# Patient Record
Sex: Female | Born: 1940 | Race: White | Hispanic: No | Marital: Single | Smoking: Former smoker
Health system: Southern US, Community
[De-identification: ages and names within clinical notes are randomized; demographics above are authoritative.]

## PROBLEM LIST (undated history)

## (undated) DIAGNOSIS — C4490 Unspecified malignant neoplasm of skin, unspecified: Secondary | ICD-10-CM

## (undated) DIAGNOSIS — K219 Gastro-esophageal reflux disease without esophagitis: Secondary | ICD-10-CM

## (undated) DIAGNOSIS — R413 Other amnesia: Secondary | ICD-10-CM

## (undated) DIAGNOSIS — E039 Hypothyroidism, unspecified: Secondary | ICD-10-CM

## (undated) DIAGNOSIS — Z972 Presence of dental prosthetic device (complete) (partial): Secondary | ICD-10-CM

## (undated) DIAGNOSIS — I4891 Unspecified atrial fibrillation: Secondary | ICD-10-CM

## (undated) DIAGNOSIS — M1990 Unspecified osteoarthritis, unspecified site: Secondary | ICD-10-CM

## (undated) DIAGNOSIS — M47817 Spondylosis without myelopathy or radiculopathy, lumbosacral region: Secondary | ICD-10-CM

## (undated) DIAGNOSIS — M4316 Spondylolisthesis, lumbar region: Secondary | ICD-10-CM

## (undated) DIAGNOSIS — E119 Type 2 diabetes mellitus without complications: Secondary | ICD-10-CM

## (undated) DIAGNOSIS — I10 Essential (primary) hypertension: Secondary | ICD-10-CM

## (undated) DIAGNOSIS — T753XXA Motion sickness, initial encounter: Secondary | ICD-10-CM

## (undated) DIAGNOSIS — C801 Malignant (primary) neoplasm, unspecified: Secondary | ICD-10-CM

## (undated) DIAGNOSIS — E079 Disorder of thyroid, unspecified: Secondary | ICD-10-CM

## (undated) HISTORY — PX: LUNG BIOPSY: SHX232

## (undated) HISTORY — PX: COLONOSCOPY: SHX174

## (undated) HISTORY — PX: BILATERAL CARPAL TUNNEL RELEASE: SHX6508

## (undated) HISTORY — PX: PORTACATH PLACEMENT: SHX2246

## (undated) HISTORY — PX: CHOLECYSTECTOMY: SHX55

## (undated) HISTORY — PX: TONSILLECTOMY: SUR1361

---

## 2010-11-02 ENCOUNTER — Ambulatory Visit: Payer: Self-pay | Admitting: Family Medicine

## 2011-04-09 ENCOUNTER — Ambulatory Visit: Payer: Self-pay | Admitting: Internal Medicine

## 2011-06-11 DIAGNOSIS — H2510 Age-related nuclear cataract, unspecified eye: Secondary | ICD-10-CM | POA: Insufficient documentation

## 2011-06-11 DIAGNOSIS — H43819 Vitreous degeneration, unspecified eye: Secondary | ICD-10-CM | POA: Insufficient documentation

## 2011-06-11 DIAGNOSIS — H268 Other specified cataract: Secondary | ICD-10-CM | POA: Insufficient documentation

## 2012-07-27 ENCOUNTER — Ambulatory Visit: Payer: Self-pay | Admitting: Internal Medicine

## 2013-04-15 DIAGNOSIS — M720 Palmar fascial fibromatosis [Dupuytren]: Secondary | ICD-10-CM | POA: Insufficient documentation

## 2013-07-14 DIAGNOSIS — M545 Low back pain, unspecified: Secondary | ICD-10-CM | POA: Insufficient documentation

## 2013-07-14 DIAGNOSIS — G8929 Other chronic pain: Secondary | ICD-10-CM | POA: Insufficient documentation

## 2013-08-15 DIAGNOSIS — Z720 Tobacco use: Secondary | ICD-10-CM | POA: Insufficient documentation

## 2013-09-07 ENCOUNTER — Ambulatory Visit: Payer: Self-pay | Admitting: Physician Assistant

## 2013-10-14 DIAGNOSIS — S32009A Unspecified fracture of unspecified lumbar vertebra, initial encounter for closed fracture: Secondary | ICD-10-CM | POA: Insufficient documentation

## 2013-10-14 DIAGNOSIS — S32000A Wedge compression fracture of unspecified lumbar vertebra, initial encounter for closed fracture: Secondary | ICD-10-CM | POA: Insufficient documentation

## 2013-10-14 DIAGNOSIS — E039 Hypothyroidism, unspecified: Secondary | ICD-10-CM | POA: Insufficient documentation

## 2014-01-02 ENCOUNTER — Ambulatory Visit: Payer: Self-pay | Admitting: Family Medicine

## 2014-01-05 DIAGNOSIS — I480 Paroxysmal atrial fibrillation: Secondary | ICD-10-CM | POA: Insufficient documentation

## 2014-01-12 DIAGNOSIS — I10 Essential (primary) hypertension: Secondary | ICD-10-CM | POA: Insufficient documentation

## 2014-02-11 IMAGING — CR RIGHT FOOT COMPLETE - 3+ VIEW
1 series · 3 of 3 positions shown · non-contrast
Comparison: none

REASON FOR EXAM: Pain in joint involving ankle and foot right foot pain
COMMENTS:

[Series 1: ap · 0.17mm/px · 3 of 3 slices shown]
[im 1/3]
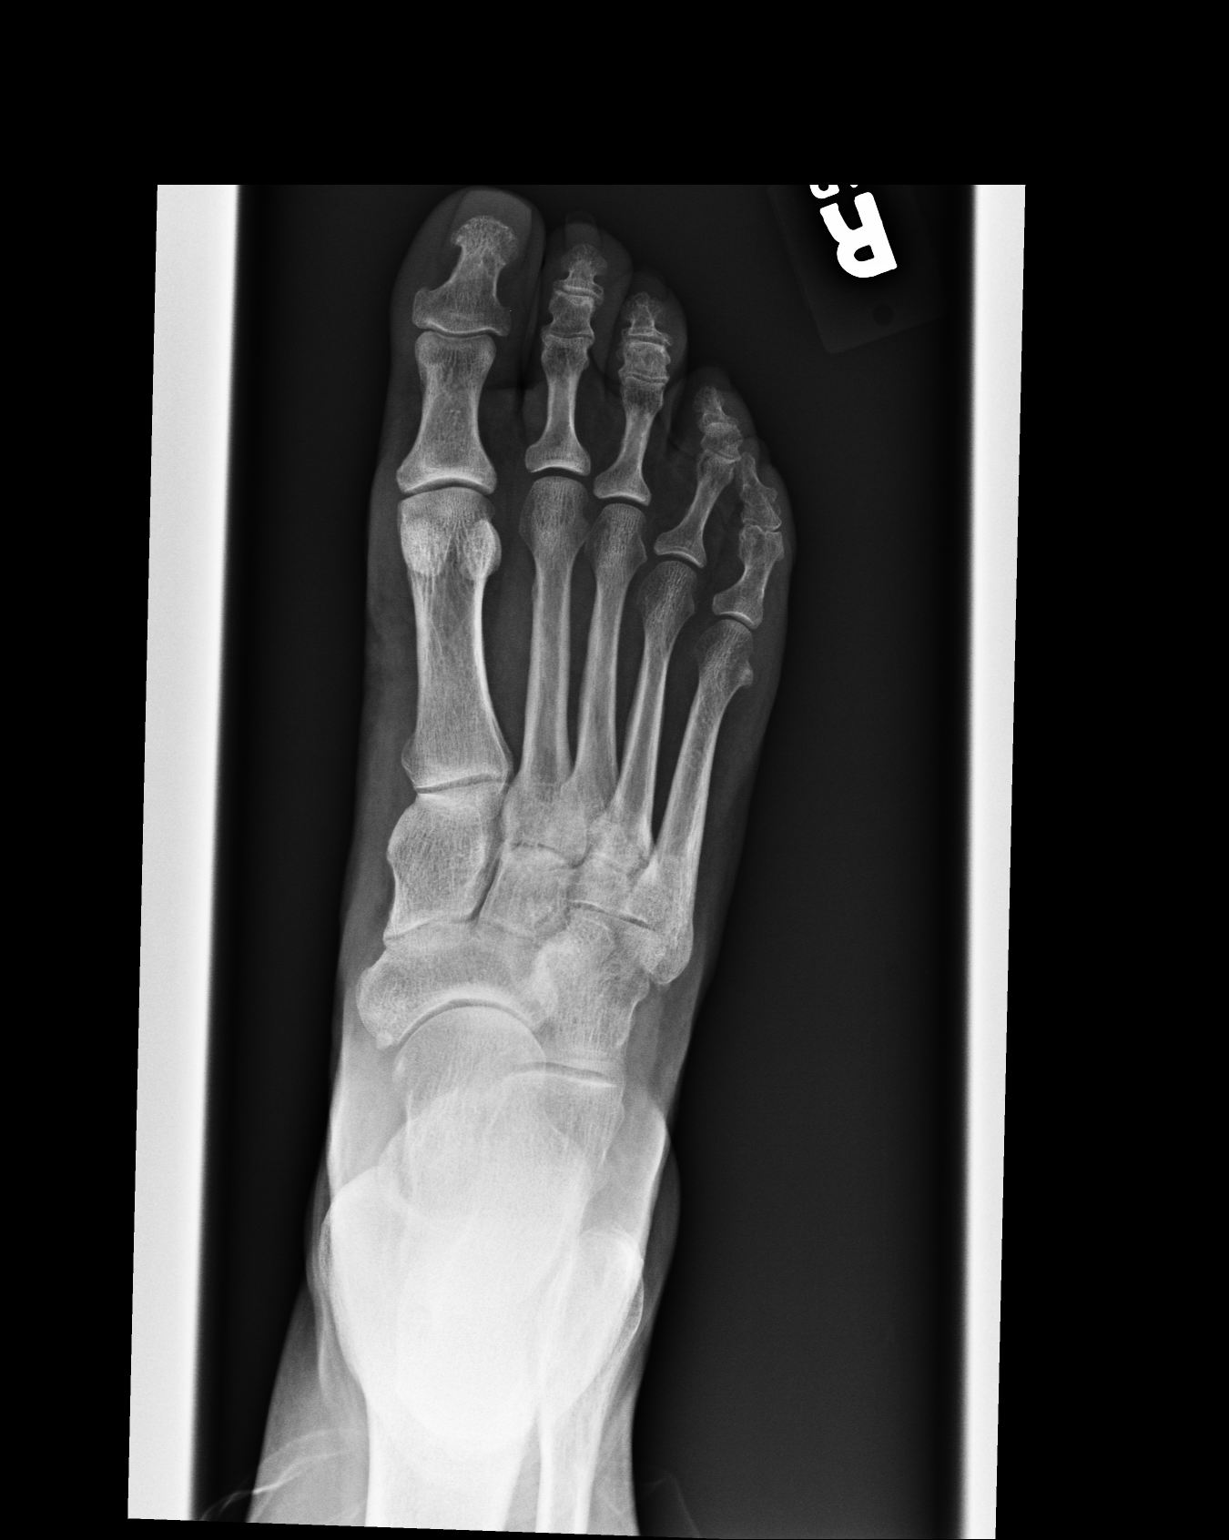
[im 2/3]
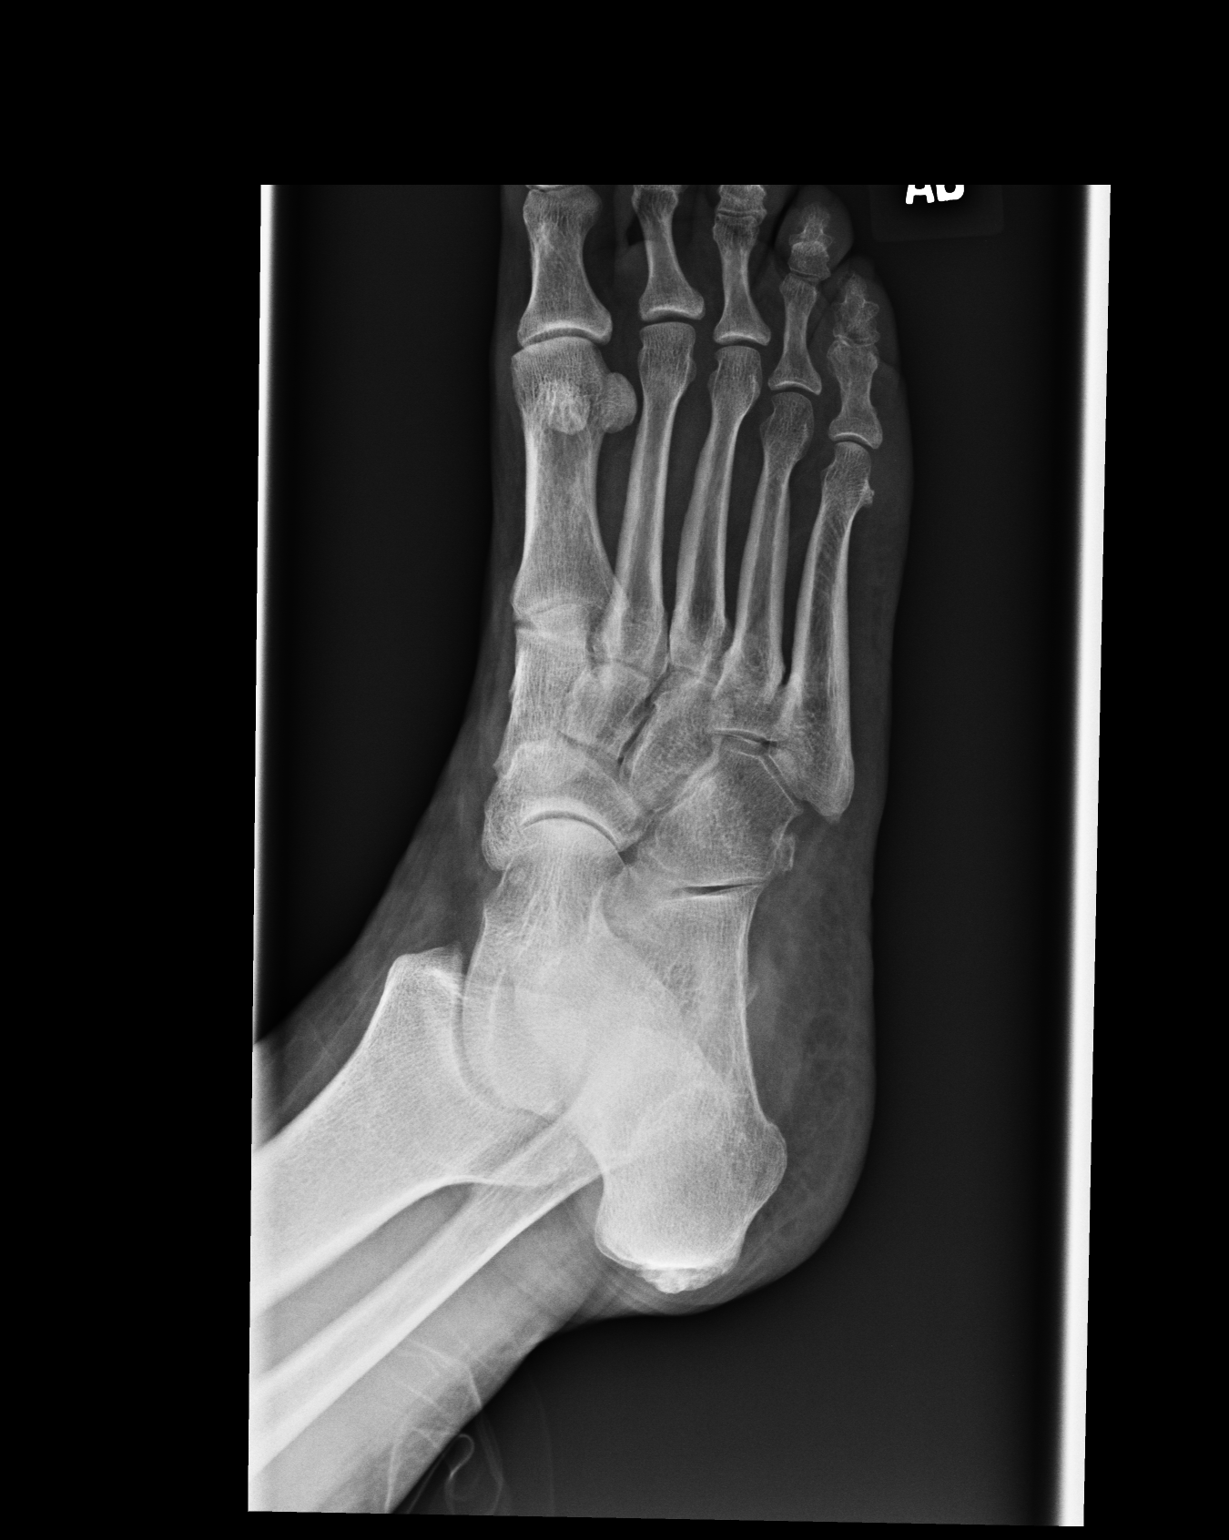
[im 3/3]
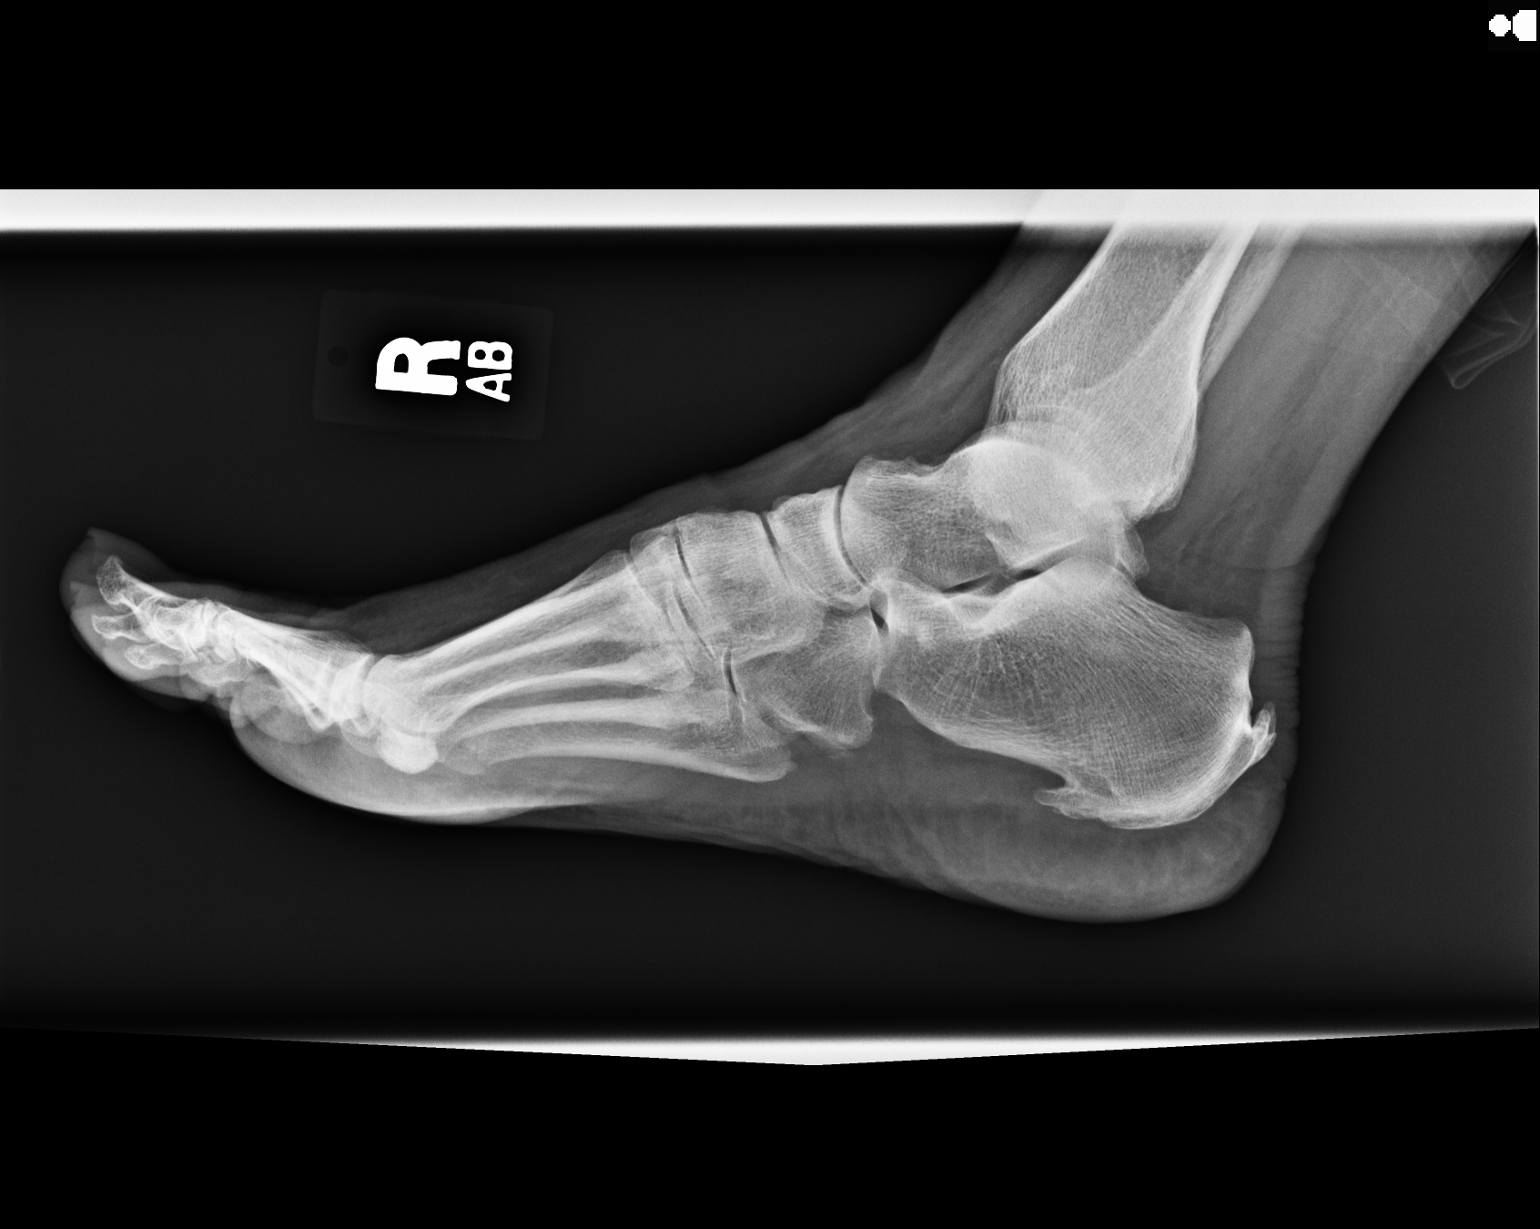

[3 of 3 positions shown; findings below may reference images not displayed]

PROCEDURE:     MDR - MDR FOOT RT COMP W/OBLIQUES  - July 27, 2012  [DATE]

RESULT:     AP oblique and lateral views of the right foot are submitted.

The bones appear reasonably well mineralized. Very mild interphalangeal
joint degenerative changes present. The metatarsophalangeal joints appear
normal for age. The tarsometatarsal joints are grossly normal. There are
prominent Achilles and plantar calcaneal spurs.
IMPRESSION: There are mild degenerative changes of the foot. I do not
see significant abnormality of the tarsometatarsal joints. Further
evaluation of the foot with MRI may be useful given the chronicity of the
symptoms.

[REDACTED]

## 2014-05-19 DIAGNOSIS — M549 Dorsalgia, unspecified: Secondary | ICD-10-CM | POA: Insufficient documentation

## 2014-05-19 DIAGNOSIS — M545 Low back pain, unspecified: Secondary | ICD-10-CM | POA: Insufficient documentation

## 2014-07-25 DIAGNOSIS — R0602 Shortness of breath: Secondary | ICD-10-CM | POA: Insufficient documentation

## 2014-07-25 DIAGNOSIS — IMO0001 Reserved for inherently not codable concepts without codable children: Secondary | ICD-10-CM | POA: Insufficient documentation

## 2014-07-25 DIAGNOSIS — R5383 Other fatigue: Secondary | ICD-10-CM | POA: Insufficient documentation

## 2014-07-25 DIAGNOSIS — R6889 Other general symptoms and signs: Secondary | ICD-10-CM | POA: Insufficient documentation

## 2014-09-25 DIAGNOSIS — C3490 Malignant neoplasm of unspecified part of unspecified bronchus or lung: Secondary | ICD-10-CM | POA: Insufficient documentation

## 2014-11-02 DIAGNOSIS — K208 Other esophagitis without bleeding: Secondary | ICD-10-CM | POA: Insufficient documentation

## 2014-11-04 DIAGNOSIS — C7951 Secondary malignant neoplasm of bone: Secondary | ICD-10-CM | POA: Insufficient documentation

## 2015-03-03 ENCOUNTER — Other Ambulatory Visit: Payer: Self-pay

## 2015-03-03 ENCOUNTER — Ambulatory Visit
Admission: EM | Admit: 2015-03-03 | Discharge: 2015-03-03 | Disposition: A | Discharge status: Other Acute Inpatient Hospital | Payer: Medicare PPO | Attending: Family Medicine | Admitting: Family Medicine

## 2015-03-03 ENCOUNTER — Encounter: Payer: Self-pay | Admitting: Emergency Medicine

## 2015-03-03 DIAGNOSIS — E079 Disorder of thyroid, unspecified: Secondary | ICD-10-CM | POA: Insufficient documentation

## 2015-03-03 DIAGNOSIS — R4781 Slurred speech: Secondary | ICD-10-CM | POA: Insufficient documentation

## 2015-03-03 DIAGNOSIS — I10 Essential (primary) hypertension: Secondary | ICD-10-CM | POA: Diagnosis not present

## 2015-03-03 DIAGNOSIS — Z79899 Other long term (current) drug therapy: Secondary | ICD-10-CM | POA: Insufficient documentation

## 2015-03-03 DIAGNOSIS — R4701 Aphasia: Secondary | ICD-10-CM | POA: Insufficient documentation

## 2015-03-03 DIAGNOSIS — I4891 Unspecified atrial fibrillation: Secondary | ICD-10-CM | POA: Insufficient documentation

## 2015-03-03 DIAGNOSIS — R55 Syncope and collapse: Secondary | ICD-10-CM

## 2015-03-03 DIAGNOSIS — R4182 Altered mental status, unspecified: Secondary | ICD-10-CM | POA: Diagnosis not present

## 2015-03-03 DIAGNOSIS — C7951 Secondary malignant neoplasm of bone: Secondary | ICD-10-CM | POA: Insufficient documentation

## 2015-03-03 DIAGNOSIS — R531 Weakness: Secondary | ICD-10-CM | POA: Diagnosis present

## 2015-03-03 HISTORY — DX: Malignant (primary) neoplasm, unspecified: C80.1

## 2015-03-03 HISTORY — DX: Disorder of thyroid, unspecified: E07.9

## 2015-03-03 HISTORY — DX: Essential (primary) hypertension: I10

## 2015-03-03 HISTORY — DX: Unspecified atrial fibrillation: I48.91

## 2015-03-03 LAB — GLUCOSE, CAPILLARY: GLUCOSE-CAPILLARY: 174 mg/dL — AB (ref 65–99)

## 2015-03-03 NOTE — ED Notes (Signed)
Patient does not exhibit any neurological symptoms and is being evaluated by Dr.Conty at this time, pt refuses to go to the ed by EMS, requests to go with her daughter to [facility] ed at this time, pt stable to be transferred , verbalizes understanding, VSS at this time

## 2015-03-03 NOTE — ED Notes (Signed)
Pt presents here with the sudden onset of dizziness/ slurred speech and weakness 15 min before arrival

## 2015-03-03 NOTE — ED Provider Notes (Addendum)
CSN: [id number]     Arrival date & time 03/03/15  1342 History   First MD Initiated Contact with Patient 03/03/15 1401     Chief Complaint  Patient presents with  . Aphasia  . Altered Mental Status  . Weakness   (Consider location/radiation/quality/duration/timing/severity/associated sxs/prior Treatment) HPI Comments: 74 yo female with a h/o metastatic cancer, receiving immunotherapy presents with a h/o of sudden onset of slurred speech, slight disorientation, pre-syncope about 30 minutes ago lasting about 5-10 min while shopping with her daughter. Patient's daughter who is an Therapist, sports at [facility] states patient had similar symptoms yesterday after receiving her immunotherapy. Patient also has a h/o intermittent atrial fibrillation. Currently patient states feels okay. Denies any neurologic symptoms, chest pains, or shortness of breath.   The history is provided by the patient.    Past Medical History  Diagnosis Date  . 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 Hypertension   . Thyroid disease   . Atrial fibrillation    History reviewed. No pertinent past surgical history. History reviewed. No pertinent family history. History  Substance Use Topics  . Smoking status: Never Smoker   . Smokeless tobacco: Not on file  . Alcohol Use: No   OB History    No data available     Review of Systems  Allergies  Tegaderm ag mesh  Home Medications   Prior to Admission medications   Medication Sig Start Date End Date Taking? Authorizing Provider  amLODipine (NORVASC) 5 MG tablet Take 5 mg by mouth daily.   Yes Historical Provider, MD  aspirin 81 MG tablet Take 81 mg by mouth daily.   Yes Historical Provider, MD  citalopram (CELEXA) 10 MG tablet Take 10 mg by mouth daily.   Yes Historical Provider, MD  dexamethasone (DECADRON) 2 MG tablet Take 2 mg by mouth 2 (two) times daily with a meal.   Yes Historical Provider, MD  digoxin (LANOXIN) 0.125 MG tablet Take by mouth daily.   Yes Historical  Provider, MD  docusate sodium (COLACE) 100 MG capsule Take 100 mg by mouth 2 (two) times daily.   Yes Historical Provider, MD  dronabinol (MARINOL) 10 MG capsule Take 10 mg by mouth 2 (two) times daily before a meal.   Yes Historical Provider, MD  esomeprazole (NEXIUM) 40 MG capsule Take 40 mg by mouth daily at 12 noon.   Yes Historical Provider, MD  gabapentin (NEURONTIN) 300 MG capsule Take 300 mg by mouth 3 (three) times daily.   Yes Historical Provider, MD  levothyroxine (SYNTHROID, LEVOTHROID) 88 MCG tablet Take 88 mcg by mouth daily before breakfast.   Yes Historical Provider, MD  losartan (COZAAR) 100 MG tablet Take 100 mg by mouth daily.   Yes Historical Provider, MD  metoprolol succinate (TOPROL-XL) 50 MG 24 hr tablet Take 50 mg by mouth 2 (two) times daily. Take with or immediately following a meal.   Yes Historical Provider, MD  OxyCODONE (OXYCONTIN) 10 mg T12A 12 hr tablet Take 10 mg by mouth every 12 (twelve) hours.   Yes Historical Provider, MD  oxyCODONE (ROXICODONE) 15 MG immediate release tablet Take 15 mg by mouth every 6 (six) hours as needed for pain.   Yes Historical Provider, MD  potassium citrate (UROCIT-K) 10 MEQ (1080 MG) SR tablet Take 20 mEq by mouth 3 (three) times daily with meals.   Yes Historical Provider, MD  Red Yeast Rice 600 MG CAPS Take by mouth.   Yes  Historical Provider, MD   BP 151/64 mmHg  Pulse 54  Temp(Src) 98.5 F (36.9 C) (Oral)  Ht '5\' 6"'$  (1.676 m)  Wt 166 lb (75.297 kg)  BMI 26.81 kg/m2  SpO2 94% Physical Exam  Constitutional: She is oriented to person, place, and time. She appears well-developed and well-nourished. No distress.  HENT:  Head: Normocephalic and atraumatic.  Right Ear: Tympanic membrane and ear canal normal.  Left Ear: Tympanic membrane and ear canal normal.  Mouth/Throat: Mucous membranes are normal.  Eyes: Conjunctivae and EOM are normal. Pupils are equal, round, and reactive to light. No scleral icterus.  Neck: Normal range  of motion. Neck supple. No tracheal deviation present.  Cardiovascular: Normal rate, regular rhythm, normal heart sounds and intact distal pulses.   No murmur heard. Pulmonary/Chest: Effort normal and breath sounds normal. No respiratory distress. She has no wheezes. She has no rales. She exhibits no tenderness.  Abdominal: Soft. Bowel sounds are normal. She exhibits no distension and no mass. There is no tenderness. There is no rebound and no guarding.  Musculoskeletal: She exhibits no edema or tenderness.  Neurological: She is alert and oriented to person, place, and time. She has normal reflexes. She displays normal reflexes. No cranial nerve deficit. She exhibits normal muscle tone. Coordination normal.  Skin: Skin is warm and dry. No rash noted. She is not diaphoretic.  Psychiatric: She has a normal mood and affect. Her behavior is normal. Judgment and thought content normal.  Nursing note and vitals reviewed.   ED Course  Procedures (including critical care time) Labs Review Labs Reviewed  GLUCOSE, CAPILLARY - Abnormal; Notable for the following:    Glucose-Capillary 174 (*)    All other components within normal limits    Imaging Review No results found. EKG: there are no previous tracings available for comparison, sinus bradycardia; reviewed by me.  MDM   1. Altered mental status, unspecified altered mental status type   2. Slurred speech   3. Pre-syncope   (symptoms resolved now but recurrent episodes since yesterday; currently non-focal neurologic exam; possible TIAs)   Plan: 1.  diagnosis reviewed with patient and daughter; recommend patient go to ED for further evaluation and possible CT scan of head/brain. Patient refuses to be transferred by ambulance/EMS. Daughter will drive patient to [facility] ED where patient has been receiving care.    Norval Gable, MD 03/03/15 [place], MD 03/03/15 1700

## 2015-03-25 IMAGING — CT CT HEAD WITHOUT CONTRAST
1 series · 15 of 30 positions shown, 19 images · non-contrast
Comparison: None.

CLINICAL DATA: Status post fall striking the head, now with nausea

EXAM:
CT HEAD WITHOUT CONTRAST
TECHNIQUE: Contiguous axial images were obtained from the base of the skull
through the vertex without intravenous contrast.

[Series 3: head wo · axial · 0.42mm/px · z∈[+18,+158]mm · 15 of 32 slices shown, 19 images]
[im 2/32  brain]
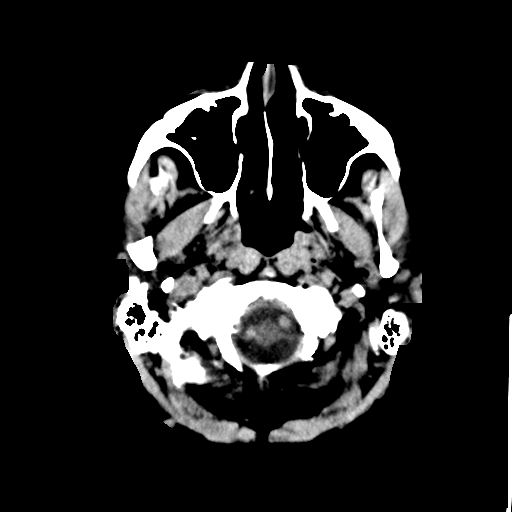
[im 2/32  bone]
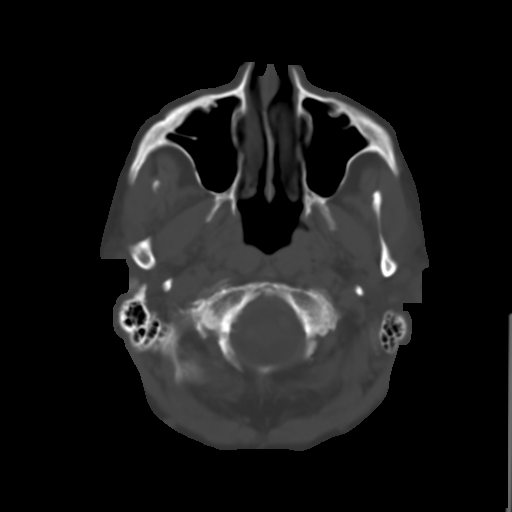
[im 4/32  brain]
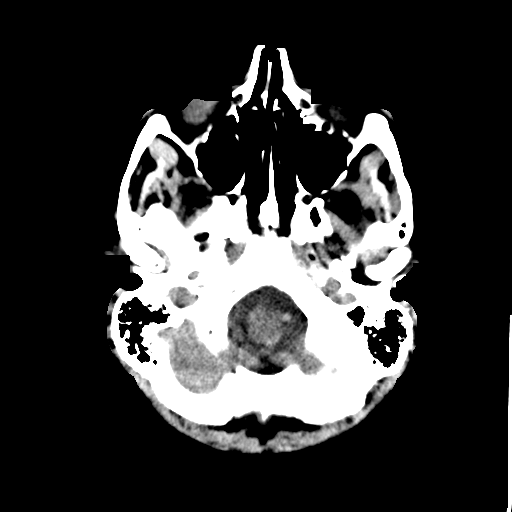
[im 6/32  brain]
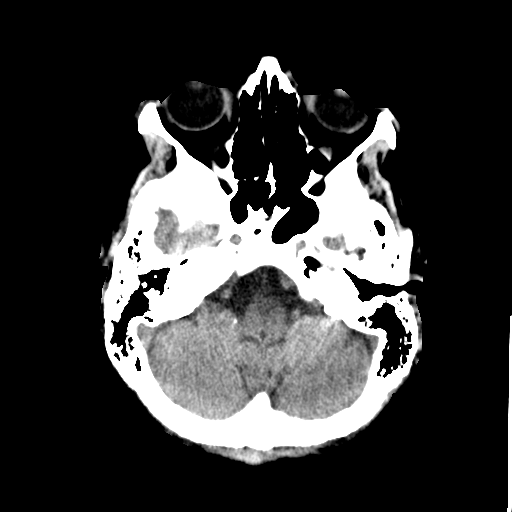
[im 8/32  brain]
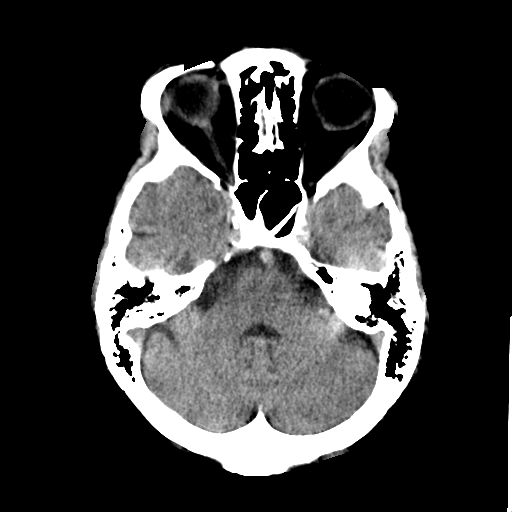
[im 10/32  brain]
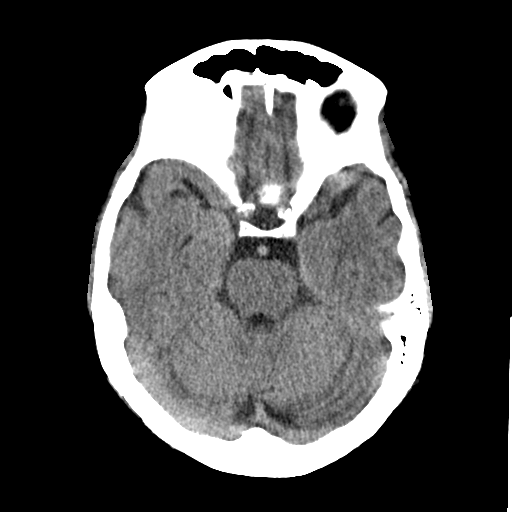
[im 10/32  bone]
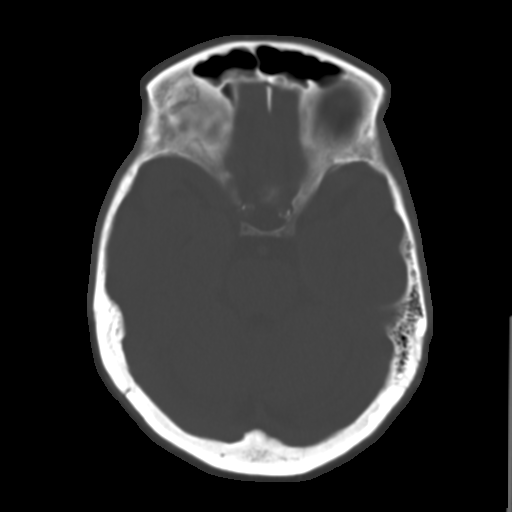
[im 12/32  brain]
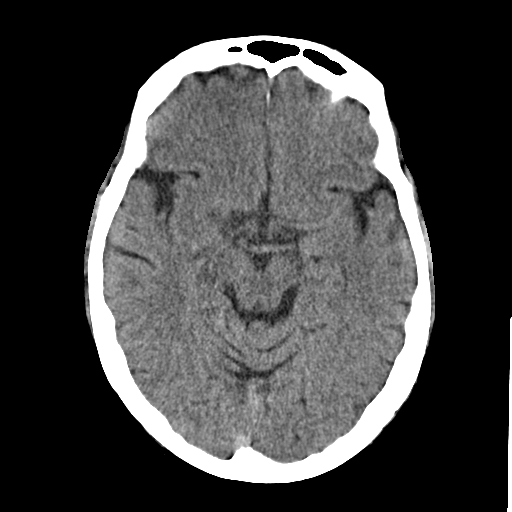
[im 14/32  brain]
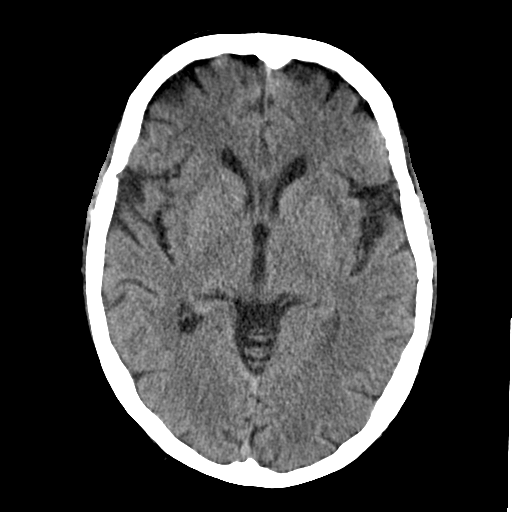
[im 17/32  brain]
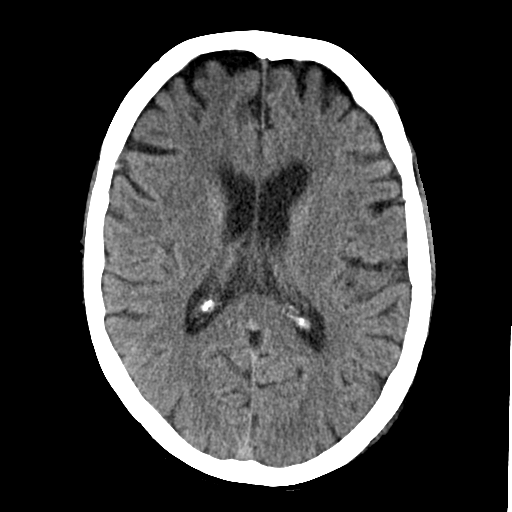
[im 18/32  brain]
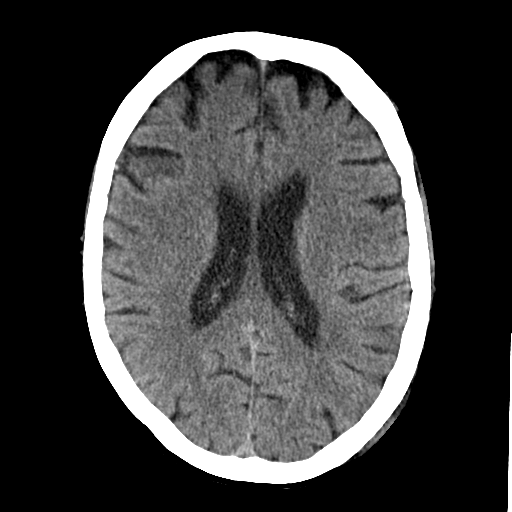
[im 18/32  bone]
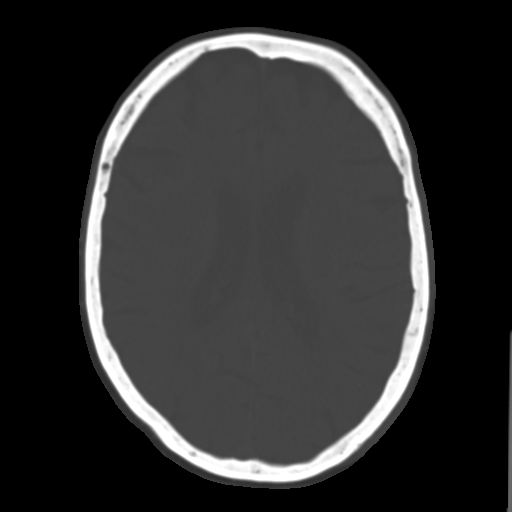
[im 20/32  brain]
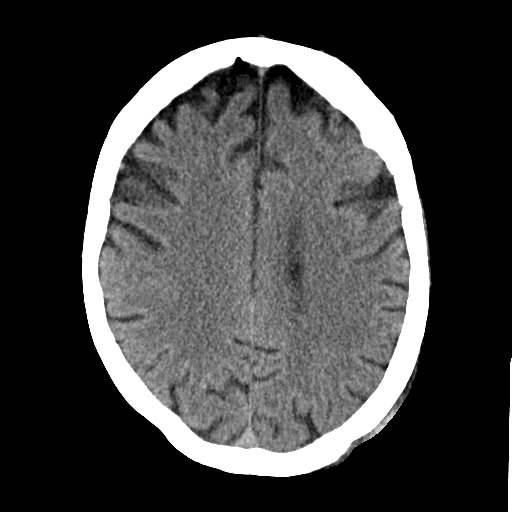
[im 22/32  brain]
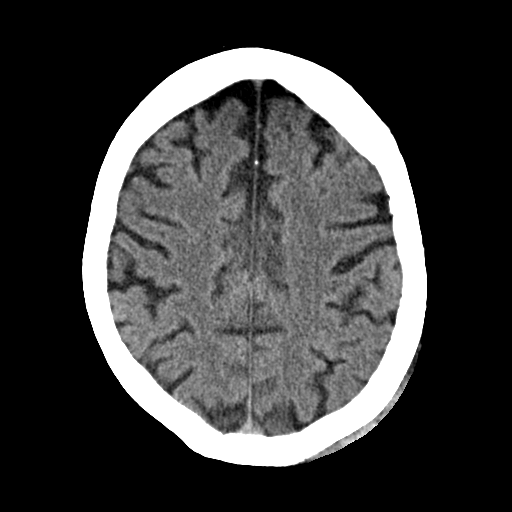
[im 24/32  brain]
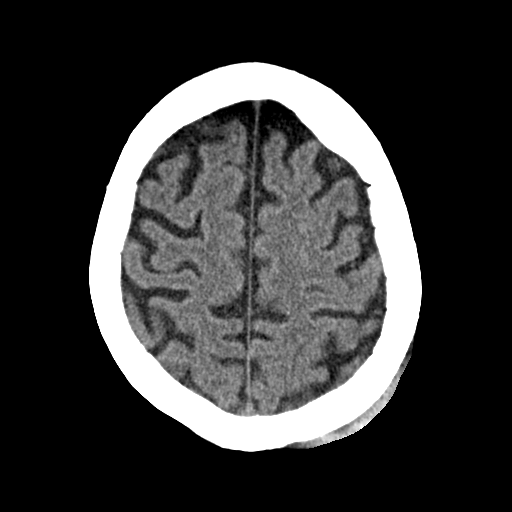
[im 26/32  brain]
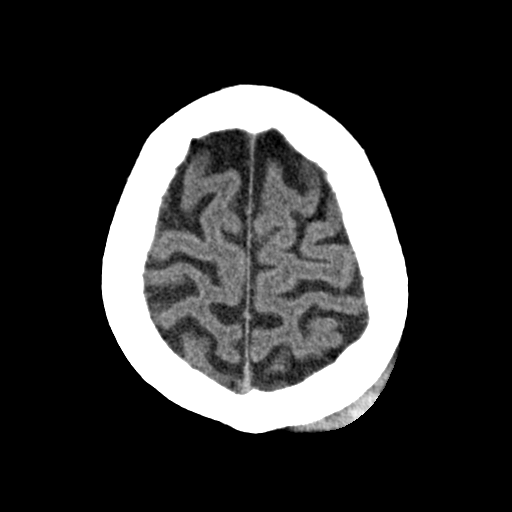
[im 26/32  bone]
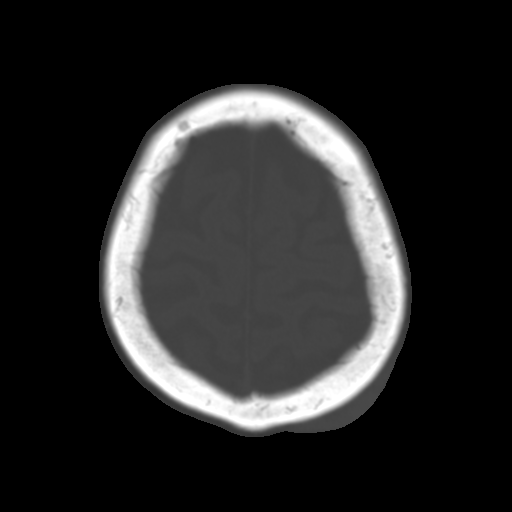
[im 28/32  brain]
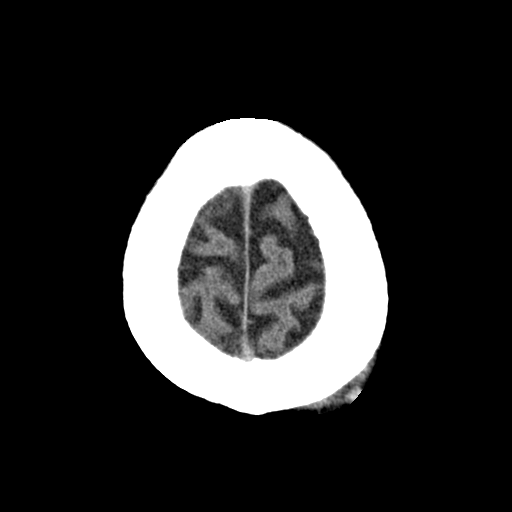
[im 30/32  brain]
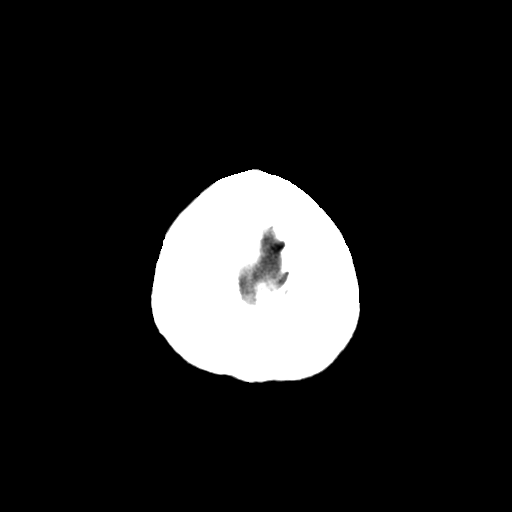

[15 of 30 positions shown; findings below may reference images not displayed]

FINDINGS: There is mild diffuse cerebral atrophy. There is no evidence of
intracranial mass effect. There is no evidence of an acute
intracranial hemorrhage nor of an evolving ischemic infarction. The
cerebellum and brainstem are normal in density. There are no
abnormal intracranial calcifications.

At bone window settings there is a cephalohematoma over the left
posterior parietal bone. There is no underlying skull fracture. The
observed portions of the paranasal sinuses and mastoid air cells are
clear.
IMPRESSION: 1. There is no evidence of an acute intracranial hemorrhage nor of
an an acute ischemic event.
2. There is no evidence of an acute skull fracture. There is a
cephalohematoma over the left posterior parietal region without
evidence of underlying bony abnormality.
3. These results will be called to the ordering clinician or
representative by the Radiologist Assistant, and communication
documented in the PACS Dashboard.

## 2015-03-27 DIAGNOSIS — K589 Irritable bowel syndrome without diarrhea: Secondary | ICD-10-CM | POA: Insufficient documentation

## 2015-03-27 DIAGNOSIS — Z8673 Personal history of transient ischemic attack (TIA), and cerebral infarction without residual deficits: Secondary | ICD-10-CM | POA: Insufficient documentation

## 2015-03-27 DIAGNOSIS — Z862 Personal history of diseases of the blood and blood-forming organs and certain disorders involving the immune mechanism: Secondary | ICD-10-CM | POA: Insufficient documentation

## 2015-04-20 DIAGNOSIS — R410 Disorientation, unspecified: Secondary | ICD-10-CM | POA: Insufficient documentation

## 2015-04-23 DIAGNOSIS — R4182 Altered mental status, unspecified: Secondary | ICD-10-CM | POA: Insufficient documentation

## 2015-04-23 DIAGNOSIS — G8220 Paraplegia, unspecified: Secondary | ICD-10-CM | POA: Insufficient documentation

## 2015-05-02 DIAGNOSIS — M899 Disorder of bone, unspecified: Secondary | ICD-10-CM | POA: Insufficient documentation

## 2015-05-04 ENCOUNTER — Encounter: Payer: Self-pay | Admitting: Family Medicine

## 2015-05-04 ENCOUNTER — Ambulatory Visit (INDEPENDENT_AMBULATORY_CARE_PROVIDER_SITE_OTHER): Payer: Medicare PPO | Admitting: Family Medicine

## 2015-05-04 VITALS — BP 126/60 | HR 58 | Ht 66.5 in | Wt 175.4 lb

## 2015-05-04 DIAGNOSIS — Z862 Personal history of diseases of the blood and blood-forming organs and certain disorders involving the immune mechanism: Secondary | ICD-10-CM | POA: Insufficient documentation

## 2015-05-04 DIAGNOSIS — I4891 Unspecified atrial fibrillation: Secondary | ICD-10-CM | POA: Insufficient documentation

## 2015-05-04 DIAGNOSIS — C3490 Malignant neoplasm of unspecified part of unspecified bronchus or lung: Secondary | ICD-10-CM

## 2015-05-04 NOTE — Progress Notes (Signed)
Date:  05/04/2015   Name:  Anna Wheeler   DOB:  [birth year]   MRN:  [id number]  PCP:  Adline Potter, MD    Chief Complaint: Establish Care   History of Present Illness:  This is a 74 y.o. female with lung cancer with bone mets undergoing active treatment, hospitalized at [facility] last week for mental status changes. Meds reconciled today, no refills needed. Pt thought we were affiliated directly with [facility], has not tried to establish with [facility] primary care practice. After discussion, agreed that patient would be best served obtaining care within the same system.  Review of Systems:  Review of Systems  Musculoskeletal:       No acute pain    Patient Active Problem List   Diagnosis Date Noted  . 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 H/O transient cerebral ischemia 03/27/2015  . Bone metastases 11/04/2014  . 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 Cancer of lung 09/25/2014  . Fatigue 07/25/2014  . LBP (low back pain) 05/19/2014  . BP (high blood pressure) 01/12/2014  . AF (paroxysmal atrial fibrillation) 01/05/2014  . Adult hypothyroidism 10/14/2013  . Compression fracture of lumbar vertebra 10/14/2013  . Current tobacco use 08/15/2013  . Chronic LBP 07/14/2013  . Contracture of palmar fascia (Dupuytren's) 04/15/2013  . Vitreous degeneration 06/11/2011  . Cataract, nuclear sclerotic senile 06/11/2011  . 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 Authorizing Provider  ACCU-CHEK AVIVA PLUS test strip 3 (three) times daily. as directed 04/27/15  Yes Historical Provider, MD  ACCU-CHEK SOFTCLIX LANCETS lancets USE 1 [place]. 04/27/15  Yes Historical Provider, MD  amLODipine (NORVASC) 10 MG tablet Take 10 mg by mouth daily. 04/27/15  Yes Historical Provider, MD  aspirin 81 MG tablet Take 325 mg by mouth daily.    Yes  Historical Provider, MD  citalopram (CELEXA) 10 MG tablet Take 10 mg by mouth daily.   Yes Historical Provider, MD  dexamethasone (DECADRON) 2 MG tablet Take 2 mg by mouth 2 (two) times daily with a meal.   Yes Historical Provider, MD  digoxin (LANOXIN) 0.125 MG tablet Take by mouth daily.   Yes Historical Provider, MD  docusate sodium (COLACE) 100 MG capsule Take 100 mg by mouth 2 (two) times daily.   Yes Historical Provider, MD  esomeprazole (NEXIUM) 40 MG capsule Take 40 mg by mouth daily at 12 noon.   Yes Historical Provider, MD  levothyroxine (SYNTHROID, LEVOTHROID) 88 MCG tablet Take 88 mcg by mouth daily before breakfast.   Yes Historical Provider, MD  losartan (COZAAR) 100 MG tablet Take 100 mg by mouth daily.   Yes Historical Provider, MD  LYRICA 50 MG capsule TAKE ONE CAPSULE BY MOUTH TWO TIMES DAILY 04/27/15  Yes Historical Provider, MD  metFORMIN (GLUCOPHAGE) 500 MG tablet TAKE 1 TAB BY MOUTH 2X DAILY, INCREASE TO 2TABS 2XDAILY IN 1 WEEK IF GLUCOSE >200 04/27/15  Yes Historical Provider, MD  metoprolol succinate (TOPROL-XL) 50 MG 24 hr tablet Take 50 mg by mouth 2 (two) times daily. Take with or immediately following a meal.   Yes Historical Provider, MD  oxyCODONE (ROXICODONE) 15 MG immediate release tablet Take 5 mg by mouth every 4 (four) hours as needed for pain.    Yes Historical Provider, MD  potassium citrate (UROCIT-K) 10 MEQ (1080 MG) SR tablet Take 20 mEq by  mouth 3 (three) times daily with meals.   Yes Historical Provider, MD  promethazine (PHENERGAN) 25 MG tablet TAKE 1/2 TABLET (12.5 MG TOTAL) BY MOUTH EVERY EIGHT (8) HOURS AS NEEDED FOR NAUSEA. 02/17/15  Yes Historical Provider, MD  Red Yeast Rice 600 MG CAPS Take by mouth.   Yes Historical Provider, MD    Allergies  Allergen Reactions  . Tegaderm Ag Mesh [Silver]   . Tape Rash    Use only gauze and paper or silicone-based tape    No past surgical history on file.  Social History  Substance Use Topics  . Smoking  status: Never Smoker   . Smokeless tobacco: None  . Alcohol Use: No    No family history on file.  Medication list has been reviewed and updated.  Physical Examination: BP 126/60 mmHg  Pulse 58  Ht 5' 6.5" (1.689 m)  Wt 175 lb 6.4 oz (79.561 kg)  BMI 27.89 kg/m2  Physical Exam  Constitutional: She appears well-developed and well-nourished. No distress.  Nursing note and vitals reviewed.   Assessment and Plan:  1. Malignant neoplasm of lung, unspecified laterality, unspecified part of lung Sees oncologist at [facility], receiving active treatment, recently hospitalized. Agreed patient would be best served receiving her primary care through [facility] as well.  Return if symptoms worsen or fail to improve.  Satira Anis. [facility]  05/04/2015

## 2016-01-08 ENCOUNTER — Encounter: Payer: Self-pay | Admitting: *Deleted

## 2016-01-11 NOTE — Discharge Instructions (Signed)

## 2016-01-14 ENCOUNTER — Encounter
Admission: RE | Disposition: A | Discharge status: Home / Self Care | Payer: Self-pay | Source: Ambulatory Visit | Attending: Ophthalmology

## 2016-01-14 ENCOUNTER — Ambulatory Visit: Payer: Medicare HMO | Admitting: Anesthesiology

## 2016-01-14 ENCOUNTER — Ambulatory Visit
Admission: RE | Admit: 2016-01-14 | Discharge: 2016-01-14 | Disposition: A | Discharge status: Home / Self Care | Payer: Medicare HMO | Source: Ambulatory Visit | Attending: Ophthalmology | Admitting: Ophthalmology

## 2016-01-14 DIAGNOSIS — E039 Hypothyroidism, unspecified: Secondary | ICD-10-CM | POA: Diagnosis not present

## 2016-01-14 DIAGNOSIS — Z9049 Acquired absence of other specified parts of digestive tract: Secondary | ICD-10-CM | POA: Insufficient documentation

## 2016-01-14 DIAGNOSIS — Z85828 Personal history of other malignant neoplasm of skin: Secondary | ICD-10-CM | POA: Insufficient documentation

## 2016-01-14 DIAGNOSIS — M1990 Unspecified osteoarthritis, unspecified site: Secondary | ICD-10-CM | POA: Insufficient documentation

## 2016-01-14 DIAGNOSIS — E119 Type 2 diabetes mellitus without complications: Secondary | ICD-10-CM | POA: Diagnosis not present

## 2016-01-14 DIAGNOSIS — K589 Irritable bowel syndrome without diarrhea: Secondary | ICD-10-CM | POA: Insufficient documentation

## 2016-01-14 DIAGNOSIS — K219 Gastro-esophageal reflux disease without esophagitis: Secondary | ICD-10-CM | POA: Diagnosis not present

## 2016-01-14 DIAGNOSIS — Z888 Allergy status to other drugs, medicaments and biological substances status: Secondary | ICD-10-CM | POA: Insufficient documentation

## 2016-01-14 DIAGNOSIS — I10 Essential (primary) hypertension: Secondary | ICD-10-CM | POA: Insufficient documentation

## 2016-01-14 DIAGNOSIS — Z87891 Personal history of nicotine dependence: Secondary | ICD-10-CM | POA: Diagnosis not present

## 2016-01-14 DIAGNOSIS — Z85850 Personal history of malignant neoplasm of thyroid: Secondary | ICD-10-CM | POA: Diagnosis not present

## 2016-01-14 DIAGNOSIS — H2511 Age-related nuclear cataract, right eye: Secondary | ICD-10-CM | POA: Diagnosis not present

## 2016-01-14 DIAGNOSIS — I4891 Unspecified atrial fibrillation: Secondary | ICD-10-CM | POA: Insufficient documentation

## 2016-01-14 DIAGNOSIS — Z85118 Personal history of other malignant neoplasm of bronchus and lung: Secondary | ICD-10-CM | POA: Insufficient documentation

## 2016-01-14 HISTORY — DX: Gastro-esophageal reflux disease without esophagitis: K21.9

## 2016-01-14 HISTORY — DX: Type 2 diabetes mellitus without complications: E11.9

## 2016-01-14 HISTORY — DX: Motion sickness, initial encounter: T75.3XXA

## 2016-01-14 HISTORY — DX: Spondylolisthesis, lumbar region: M43.16

## 2016-01-14 HISTORY — PX: CATARACT EXTRACTION W/PHACO: SHX586

## 2016-01-14 HISTORY — DX: Unspecified malignant neoplasm of skin, unspecified: C44.90

## 2016-01-14 HISTORY — DX: Other amnesia: R41.3

## 2016-01-14 HISTORY — DX: Presence of dental prosthetic device (complete) (partial): Z97.2

## 2016-01-14 HISTORY — DX: Unspecified osteoarthritis, unspecified site: M19.90

## 2016-01-14 HISTORY — DX: Hypothyroidism, unspecified: E03.9

## 2016-01-14 HISTORY — DX: Spondylosis without myelopathy or radiculopathy, lumbosacral region: M47.817

## 2016-01-14 LAB — GLUCOSE, CAPILLARY
GLUCOSE-CAPILLARY: 108 mg/dL — AB (ref 65–99)
Glucose-Capillary: 105 mg/dL — ABNORMAL HIGH (ref 65–99)

## 2016-01-14 SURGERY — PHACOEMULSIFICATION, CATARACT, WITH IOL INSERTION
Anesthesia: Monitor Anesthesia Care | Site: Eye | Laterality: Right | Wound class: Clean

## 2016-01-14 MED ORDER — TETRACAINE HCL 0.5 % OP SOLN
1.0000 [drp] | OPHTHALMIC | Status: DC | PRN
  Administered 2016-01-14: 1 [drp] via OPHTHALMIC

## 2016-01-14 MED ORDER — NA HYALUR & NA CHOND-NA HYALUR 0.4-0.35 ML IO KIT
PACK | INTRAOCULAR | Status: DC | PRN
  Administered 2016-01-14: 1 mL via INTRAOCULAR

## 2016-01-14 MED ORDER — ACETAMINOPHEN 160 MG/5ML PO SOLN: 325.0000 mg | ORAL | Status: DC | PRN

## 2016-01-14 MED ORDER — EPINEPHRINE HCL 1 MG/ML IJ SOLN
INTRAOCULAR | Status: DC | PRN
  Administered 2016-01-14: 145 mL via OPHTHALMIC
  Administered 2016-01-14: 08:00:00 via OPHTHALMIC

## 2016-01-14 MED ORDER — LACTATED RINGERS IV SOLN: INTRAVENOUS | Status: DC

## 2016-01-14 MED ORDER — LIDOCAINE HCL (PF) 4 % IJ SOLN
INTRAMUSCULAR | Status: DC | PRN
  Administered 2016-01-14: 1 mL via OPHTHALMIC

## 2016-01-14 MED ORDER — ARMC OPHTHALMIC DILATING GEL
1.0000 "application " | OPHTHALMIC | Status: DC | PRN
  Administered 2016-01-14 (×2): 1 via OPHTHALMIC

## 2016-01-14 MED ORDER — ACETAMINOPHEN 325 MG PO TABS: 325.0000 mg | ORAL_TABLET | ORAL | Status: DC | PRN

## 2016-01-14 MED ORDER — MIDAZOLAM HCL 2 MG/2ML IJ SOLN
INTRAMUSCULAR | Status: DC | PRN
  Administered 2016-01-14 (×2): 1 mg via INTRAVENOUS

## 2016-01-14 MED ORDER — CEFUROXIME OPHTHALMIC INJECTION 1 MG/0.1 ML
INJECTION | OPHTHALMIC | Status: DC | PRN
  Administered 2016-01-14: 0.1 mL via OPHTHALMIC

## 2016-01-14 MED ORDER — TIMOLOL MALEATE 0.5 % OP SOLN
OPHTHALMIC | Status: DC | PRN
  Administered 2016-01-14: 1 [drp] via OPHTHALMIC

## 2016-01-14 MED ORDER — FENTANYL CITRATE (PF) 100 MCG/2ML IJ SOLN
INTRAMUSCULAR | Status: DC | PRN
  Administered 2016-01-14: 50 ug via INTRAVENOUS

## 2016-01-14 MED ORDER — BRIMONIDINE TARTRATE 0.2 % OP SOLN
OPHTHALMIC | Status: DC | PRN
  Administered 2016-01-14: 1 [drp] via OPHTHALMIC

## 2016-01-14 MED ORDER — POVIDONE-IODINE 5 % OP SOLN
1.0000 "application " | OPHTHALMIC | Status: DC | PRN
  Administered 2016-01-14: 1 via OPHTHALMIC

## 2016-01-14 MED ORDER — ONDANSETRON HCL 4 MG/2ML IJ SOLN: 4.0000 mg | Freq: Once | INTRAMUSCULAR | Status: DC | PRN

## 2016-01-14 SURGICAL SUPPLY — 30 items
APPLICATOR COTTON TIP 3IN (MISCELLANEOUS) ×3 IMPLANT
CANNULA ANT/CHMB 27GA (MISCELLANEOUS) ×3 IMPLANT
DISSECTOR HYDRO NUCLEUS 50X22 (MISCELLANEOUS) ×3 IMPLANT
GLOVE BIO SURGEON STRL SZ7 (GLOVE) ×3 IMPLANT
GLOVE SURG LX 6.5 MICRO (GLOVE) ×2
GLOVE SURG LX STRL 6.5 MICRO (GLOVE) ×1 IMPLANT
GOWN STRL REUS W/ TWL LRG LVL3 (GOWN DISPOSABLE) ×2 IMPLANT
GOWN STRL REUS W/TWL LRG LVL3 (GOWN DISPOSABLE) ×4
LENS IOL ACRSF IQ ULTRA 17.0 (Intraocular Lens) ×1 IMPLANT
LENS IOL ACRYSOF IQ 17.0 (Intraocular Lens) ×3 IMPLANT
MARKER SKIN DUAL TIP RULER LAB (MISCELLANEOUS) ×3 IMPLANT
NEEDLE FILTER BLUNT 18X 1/2SAF (NEEDLE) ×2
NEEDLE FILTER BLUNT 18X1 1/2 (NEEDLE) ×1 IMPLANT
PACK CATARACT BRASINGTON (MISCELLANEOUS) ×3 IMPLANT
PACK EYE AFTER SURG (MISCELLANEOUS) ×3 IMPLANT
PACK OPTHALMIC (MISCELLANEOUS) ×3 IMPLANT
RETRACT FLEX IRIS (MISCELLANEOUS) ×3 IMPLANT
RING MALYGIN 7.0 (MISCELLANEOUS) IMPLANT
SOL BAL SALT 15ML (MISCELLANEOUS)
SOLUTION BAL SALT 15ML (MISCELLANEOUS) IMPLANT
SUT ETHILON 10-0 CS-B-6CS-B-6 (SUTURE)
SUT VICRYL  9 0 (SUTURE)
SUT VICRYL 9 0 (SUTURE) IMPLANT
SUTURE EHLN 10-0 CS-B-6CS-B-6 (SUTURE) IMPLANT
SYR 3ML LL SCALE MARK (SYRINGE) ×3 IMPLANT
SYR TB 1ML LUER SLIP (SYRINGE) ×3 IMPLANT
WATER STERILE IRR 250ML POUR (IV SOLUTION) ×3 IMPLANT
WATER STERILE IRR 500ML POUR (IV SOLUTION) IMPLANT
WICK EYE OCUCEL (MISCELLANEOUS) IMPLANT
WIPE NON LINTING 3.25X3.25 (MISCELLANEOUS) ×3 IMPLANT

## 2016-01-14 NOTE — Anesthesia Procedure Notes (Signed)
Procedure Name: MAC Performed by: Ednah Hammock Pre-anesthesia Checklist: Patient identified, Emergency Drugs available, Suction available, Timeout performed and Patient being monitored Patient Re-evaluated:Patient Re-evaluated prior to inductionOxygen Delivery Method: Nasal cannula Placement Confirmation: positive ETCO2     

## 2016-01-14 NOTE — Op Note (Signed)
Date of Surgery: 01/14/2016  PREOPERATIVE DIAGNOSES: Visually significant nuclear sclerotic cataract, right eye.  POSTOPERATIVE DIAGNOSES: Same  PROCEDURES PERFORMED: Cataract extraction with intraocular lens implant, right eye with aid of iris hooks.  SURGEON: Almon Hercules, M.D.  ANESTHESIA: MAC and topical  IMPLANTS: AcrySof IQ [id number] +17.0 via [id number] injector   Implant Name Type Inv. Item Serial No. Manufacturer Lot No. LRB No. Used  LENS IOL ACRYSOF IQ 17.0 - [id number] Intraocular Lens LENS IOL ACRYSOF IQ 17.0 [id number] ALCON   Right 1     COMPLICATIONS: None.  DESCRIPTION OF PROCEDURE: Therapeutic options were discussed with the patient preoperatively, including a discussion of risks and benefits of surgery. Informed consent was obtained. An IOL-Master and immersion biometry were used to take the lens measurements, and a dilated fundus exam was performed within 6 months of the surgical date.  The patient was premedicated and brought to the operating room and placed on the operating table in the supine position. After adequate anesthesia, the patient was prepped and draped in the usual sterile ophthalmic fashion. A wire lid speculum was inserted and the microscope was positioned. A Superblade was used to create a paracentesis site at the limbus and a small amount of dilute preservative free lidocaine was instilled into the anterior chamber, followed by dispersive viscoelastic. A clear corneal incision was created temporally using a 2.4 mm keratome blade. Capsulorrhexis was then performed. At this point the iris closed down to 50m only and in order to facilitate visualization, 4 iris hooks were placed.  In situ phacoemulsification was performed.  Cortical material was removed with the irrigation-aspiration unit. Dispersive viscoelastic was instilled to open the capsular bag. A posterior chamber intraocular lens with the specifications above was inserted and positioned. The iris hooks  were removed.  Irrigation-aspiration was used to remove all viscoelastic. Cefuroxime 1cc was instilled into the anterior chamber, and the corneal incision was checked and found to be water tight. The eyelid speculum was removed.  The operative eye was covered with protective goggles after instilling 1 drop of timolol and brimonidine. The patient tolerated the procedure well. There were no complications.

## 2016-01-14 NOTE — Anesthesia Postprocedure Evaluation (Signed)
Anesthesia Post Note  Patient: Anna Wheeler  Procedure(s) Performed: Procedure(s) (LRB): CATARACT EXTRACTION PHACO AND INTRAOCULAR LENS PLACEMENT (IOC) RIGHT EYE (Right)  Patient location during evaluation: PACU Anesthesia Type: MAC Level of consciousness: awake and alert Pain management: pain level controlled Vital Signs Assessment: post-procedure vital signs reviewed and stable Respiratory status: spontaneous breathing, nonlabored ventilation, respiratory function stable and patient connected to nasal cannula oxygen Cardiovascular status: stable and blood pressure returned to baseline Anesthetic complications: no    Amaryllis Dyke

## 2016-01-14 NOTE — Anesthesia Preprocedure Evaluation (Signed)
Anesthesia Evaluation  Patient identified by MRN, date of birth, ID band Patient awake    Reviewed: Allergy & Precautions, H&P , NPO status   Airway Mallampati: II  TM Distance: >3 FB Neck ROM: full    Dental  (+) Upper Dentures   Pulmonary former smoker,    breath sounds clear to auscultation       Cardiovascular hypertension,  Rhythm:regular Rate:Normal     Neuro/Psych    GI/Hepatic GERD  ,  Endo/Other  diabetesHypothyroidism   Renal/GU      Musculoskeletal   Abdominal   Peds  Hematology   Anesthesia Other Findings   Reproductive/Obstetrics                             Anesthesia Physical Anesthesia Plan  ASA: III  Anesthesia Plan: MAC   Post-op Pain Management:    Induction:   Airway Management Planned:   Additional Equipment:   Intra-op Plan:   Post-operative Plan:   Informed Consent: I have reviewed the patients History and Physical, chart, labs and discussed the procedure including the risks, benefits and alternatives for the proposed anesthesia with the patient or authorized representative who has indicated his/her understanding and acceptance.     Plan Discussed with: CRNA  Anesthesia Plan Comments:         Anesthesia Quick Evaluation

## 2016-01-14 NOTE — Transfer of Care (Signed)
Immediate Anesthesia Transfer of Care Note  Patient: Anna Wheeler  Procedure(s) Performed: Procedure(s) with comments: CATARACT EXTRACTION PHACO AND INTRAOCULAR LENS PLACEMENT (IOC) RIGHT EYE (Right) - DIABETIC - oral meds Port-a-cath MALYUGIN  Patient Location: PACU  Anesthesia Type: MAC  Level of Consciousness: awake, alert  and patient cooperative  Airway and Oxygen Therapy: Patient Spontanous Breathing and Patient connected to supplemental oxygen  Post-op Assessment: Post-op Vital signs reviewed, Patient's Cardiovascular Status Stable, Respiratory Function Stable, Patent Airway and No signs of Nausea or vomiting  Post-op Vital Signs: Reviewed and stable  Complications: No apparent anesthesia complications

## 2016-01-14 NOTE — H&P (Signed)
H+P reviewed and is up to date, please see paper chart.  

## 2016-01-15 ENCOUNTER — Encounter: Payer: Self-pay | Admitting: Ophthalmology

## 2017-03-11 ENCOUNTER — Ambulatory Visit
Admission: EM | Admit: 2017-03-11 | Discharge: 2017-03-11 | Disposition: A | Discharge status: Other Acute Inpatient Hospital | Payer: Medicare Other

## 2017-03-11 ENCOUNTER — Encounter: Payer: Self-pay | Admitting: Emergency Medicine

## 2017-03-11 DIAGNOSIS — W19XXXA Unspecified fall, initial encounter: Secondary | ICD-10-CM | POA: Diagnosis not present

## 2017-03-11 DIAGNOSIS — S0990XA Unspecified injury of head, initial encounter: Secondary | ICD-10-CM

## 2017-03-11 NOTE — ED Notes (Signed)
[place] called and given report of patient going by private vehicle to [facility] ED.

## 2017-03-11 NOTE — ED Triage Notes (Signed)
Patient states that she fell today and hit the back of her head.  Patient reports some nausea.  Patient has an abrasion to the back of her head.  Patient reports no LOC.

## 2017-03-11 NOTE — ED Provider Notes (Signed)
[facility]    CSN: [id number] Arrival date & time: 03/11/17  1339     History   Chief Complaint Chief Complaint  Patient presents with  . Fall  . Head Injury    HPI Anna Wheeler is a 76 y.o. female.   Patient is a 76 year old white female with history of lung cancer and after treatment chemotherapy shows cognitive dysfunction. He uses a walker to a relate and this afternoon while with her daughter she slipped and fell head of back on concrete and subsequently bounced her head on the concrete as well. According to the daughter going to see him in a brace and bleeding from the head injury she immediately had diarrhea and subsequent nausea as well. She's never thrown up but the nausea is remained that he take a home cleaner up from the diarrhea was so bad and this is unusual for her mother unless mother eats something that will trigger it and she hasn't eaten anything of significant today. She she stills has nausea and a headache the nurse he was concerned about her knowing signs of lethargy when she initially came in but she seems to better now.   The history is provided by the patient. No language interpreter was used.  Fall  This is a new problem. The current episode started 1 to 2 hours ago. The problem has not changed since onset.Associated symptoms include headaches. Pertinent negatives include no chest pain, no abdominal pain and no shortness of breath. Nothing aggravates the symptoms. Nothing relieves the symptoms. She has tried nothing for the symptoms.  Head Injury  Location:  Occipital Time since incident:  2 hours Mechanism of injury: fall   Fall:    Fall occurred:  Standing   Impact surface:  Product manager of impact:  Head and back   Entrapped after fall: no   Pain details:    Severity:  Moderate Associated symptoms: headache     Past Medical History:  Diagnosis Date  . Atrial fibrillation ([place])   . 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 Diabetes mellitus without complication (HCC)    was on Dexamethasone = High BS. Now off Dex.  MD cutting back Metformin  . GERD (gastroesophageal reflux disease)   . Hypertension   . Hypothyroidism   . Motion sickness    boats  . Osteoarthritis   . Poor short term memory   . Skin cancer   . Spondylolisthesis at L4-L5 level   . Spondylosis of lumbosacral region    L4-L5  . Thyroid disease   . Wears dentures    Full upper. attaches to posts    Patient Active Problem List   Diagnosis Date Noted  . 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 H/O transient cerebral ischemia 03/27/2015  . Bone metastases ([place]) 11/04/2014  . 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 Cancer of lung ([place]) 09/25/2014  . Fatigue 07/25/2014  . LBP (low back pain) 05/19/2014  . BP (high blood pressure) 01/12/2014  . AF (paroxysmal atrial fibrillation) ([place]) 01/05/2014  . Adult hypothyroidism 10/14/2013  . Compression fracture of lumbar vertebra ([place]) 10/14/2013  . Current tobacco use 08/15/2013  . Chronic LBP 07/14/2013  . Contracture of palmar fascia ([place]upuytren's) 04/15/2013  . Vitreous degeneration 06/11/2011  . Cataract, nuclear sclerotic senile 06/11/2011  . 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 BILATERAL CARPAL TUNNEL RELEASE    . CATARACT EXTRACTION W/PHACO Right 01/14/2016   Procedure: CATARACT EXTRACTION PHACO AND INTRAOCULAR LENS PLACEMENT (IOC) RIGHT EYE;  Surgeon: Ronnell Freshwater, MD;  Location: [place];  Service: Ophthalmology;  Laterality: Right;  DIABETIC - oral meds Port-a-cath MALYUGIN  . CHOLECYSTECTOMY    . COLONOSCOPY    . LUNG BIOPSY    . PORTACATH PLACEMENT Right   . TONSILLECTOMY      OB History    No data available       Home Medications    Prior to Admission medications   Medication Sig Start Date End Date  Taking? Authorizing Provider  ACCU-CHEK AVIVA PLUS test strip 3 (three) times daily. as directed 04/27/15   [provider]  ACCU-CHEK SOFTCLIX LANCETS lancets USE 1 [place]. 04/27/15   [provider]  aspirin 81 MG tablet Take 325 mg by mouth daily.     [provider]  buprenorphine (BUTRANS) 10 MCG/HR PTWK patch Place 10 mcg onto the skin once a week.    [provider]  citalopram (CELEXA) 10 MG tablet Take 10 mg by mouth daily.    [provider]  digoxin (LANOXIN) 0.125 MG tablet Take by mouth daily.    [provider]  docusate sodium (COLACE) 100 MG capsule Take 100 mg by mouth 2 (two) times daily.    [provider]  esomeprazole (NEXIUM) 40 MG capsule Take 40 mg by mouth daily at 12 noon.    [provider]  furosemide (LASIX) 40 MG tablet Take 40 mg by mouth daily.    [provider]  levothyroxine (SYNTHROID, LEVOTHROID) 88 MCG tablet Take 88 mcg by mouth daily before breakfast.    [provider]  losartan (COZAAR) 100 MG tablet Take 100 mg by mouth daily.    [provider]  LYRICA 50 MG capsule TAKE ONE CAPSULE BY MOUTH TWO TIMES DAILY 04/27/15   [provider]  metFORMIN (GLUCOPHAGE) 500 MG tablet TAKE 1 TAB BY MOUTH 2X DAILY, INCREASE TO 2TABS 2XDAILY IN 1 WEEK IF GLUCOSE >200 04/27/15   [provider]  metoprolol succinate (TOPROL-XL) 50 MG 24 hr tablet Take 50 mg by mouth 2 (two) times daily. Take with or immediately following a meal.    [provider]  oxyCODONE (ROXICODONE) 15 MG immediate release tablet Take 5 mg by mouth every 4 (four) hours as needed for pain.     [provider]  OXYGEN Inhale into the lungs. 1-2 L, [place]    [provider]  potassium citrate (UROCIT-K) 10 MEQ (1080 MG) SR tablet Take 20 mEq by mouth 3 (three) times daily with meals.    [provider]  promethazine (PHENERGAN) 25 MG  tablet TAKE 1/2 TABLET (12.5 MG TOTAL) BY MOUTH EVERY EIGHT (8) HOURS AS NEEDED FOR NAUSEA. 02/17/15   [provider]  Red Yeast Rice 600 MG CAPS Take by mouth.    [provider]  solifenacin (VESICARE) 10 MG tablet Take 10 mg by mouth daily.    [provider]    Family History History reviewed. No pertinent family history.  Social History Social History  Substance Use Topics  . Smoking status: Former Smoker    Years: 40.00    Types: Cigarettes  . Smokeless tobacco: Never Used     Comment: quit Dec 2013  . Alcohol use 0.6 oz/week  1 Glasses of wine per week     Allergies   Tegaderm ag mesh [silver] and Tape   Review of Systems Review of Systems  Respiratory: Negative for shortness of breath.   Cardiovascular: Negative for chest pain.  Gastrointestinal: Negative for abdominal pain.  Neurological: Positive for headaches.  All other systems reviewed and are negative.    Physical Exam Triage Vital Signs ED Triage Vitals  Enc Vitals Group     BP 03/11/17 1405 (!) 94/52     Pulse Rate 03/11/17 1405 (!) 54     Resp 03/11/17 1405 16     Temp 03/11/17 1405 98.2 F (36.8 C)     Temp Source 03/11/17 1405 Oral     SpO2 03/11/17 1405 94 %     Weight 03/11/17 1403 160 lb (72.6 kg)     Height 03/11/17 1403 5\' 3"  (1.6 m)     Head Circumference --      Peak Flow --      Pain Score 03/11/17 1403 3     Pain Loc --      Pain Edu? --      Excl. in [place]? --    No data found.   Updated Vital Signs BP (!) 94/52 (BP Location: Left Arm)   Pulse (!) 54   Temp 98.2 F (36.8 C) (Oral)   Resp 16   Ht 5\' 3"  (1.6 m)   Wt 160 lb (72.6 kg)   SpO2 94%   BMI 28.34 kg/m   Visual Acuity Right Eye Distance:   Left Eye Distance:   Bilateral Distance:    Right Eye Near:   Left Eye Near:    Bilateral Near:     Physical Exam  Constitutional: She is oriented to person, place, and time. She appears well-developed and well-nourished.  HENT:  Head:  Normocephalic. Head is with abrasion and with laceration.    Right Ear: Hearing, tympanic membrane, external ear and ear canal normal.  Left Ear: Hearing, tympanic membrane, external ear and ear canal normal.  Nose: Nose normal.  Eyes: Pupils are equal, round, and reactive to light.  Neck: Normal range of motion. Neck supple.  Cardiovascular: Normal rate and regular rhythm.   Pulmonary/Chest: Effort normal.  Musculoskeletal: Normal range of motion. She exhibits tenderness.  Neurological: She is alert and oriented to person, place, and time. She displays normal reflexes. No cranial nerve deficit or sensory deficit.  Skin: Skin is warm.  Psychiatric: She has a normal mood and affect.  Vitals reviewed.    UC Treatments / Results  Labs (all labs ordered are listed, but only abnormal results are displayed) Labs Reviewed - No data to display  EKG  EKG Interpretation None       Radiology No results found.  Procedures Procedures (including critical care time)  Medications Ordered in UC Medications - No data to display   Initial Impression / Assessment and Plan / UC Course  I have reviewed the triage vital signs and the nursing notes.  Pertinent labs & imaging results that were available during my care of the patient were reviewed by me and considered in my medical decision making (see chart for details).     She is oriented 3 however she does have cognitive delays and cognitive problems are aware of because of the cognitive problems with the explosive diarrhea and persistent nausea and headache she has now she probably will need to be observed in the hospital setting is not basis  we may need to have more than 1 CT scan of the head. Recommending since her cares at [facility] she be transferred to [facility] for care. And also nurses will take her since about). Be stable at this time  Final Clinical Impressions(s) / UC Diagnoses   Final diagnoses:  Injury of head, initial encounter    Fall, initial encounter    New Prescriptions New Prescriptions   No medications on file    Note: This dictation was prepared with Dragon dictation along with smaller phrase technology. Any transcriptional errors that result from this process are unintentional.   Frederich Cha, MD 03/11/17 1458

## 2019-10-24 DEATH — deceased
